# Patient Record
Sex: Male | Born: 2000 | Race: Black or African American | Hispanic: No | Marital: Single | Smoking: Never smoker
Health system: Southern US, Community
[De-identification: ages and names within clinical notes are randomized; demographics above are authoritative.]

---
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
## 2021-08-21 ENCOUNTER — Encounter (HOSPITAL_COMMUNITY): Payer: Self-pay | Admitting: Emergency Medicine

## 2021-08-21 ENCOUNTER — Emergency Department (HOSPITAL_COMMUNITY)
Admission: EM | Admit: 2021-08-21 | Discharge: 2021-08-21 | Disposition: A | Discharge status: Home / Self Care | Payer: Medicaid Other | Attending: Emergency Medicine | Admitting: Emergency Medicine

## 2021-08-21 DIAGNOSIS — Y990 Civilian activity done for income or pay: Secondary | ICD-10-CM | POA: Diagnosis not present

## 2021-08-21 DIAGNOSIS — X500XXA Overexertion from strenuous movement or load, initial encounter: Secondary | ICD-10-CM | POA: Insufficient documentation

## 2021-08-21 DIAGNOSIS — S3992XA Unspecified injury of lower back, initial encounter: Secondary | ICD-10-CM | POA: Diagnosis present

## 2021-08-21 DIAGNOSIS — S39012A Strain of muscle, fascia and tendon of lower back, initial encounter: Secondary | ICD-10-CM | POA: Diagnosis not present

## 2021-08-21 MED ORDER — METHOCARBAMOL 500 MG PO TABS: 500.0000 mg | ORAL_TABLET | Freq: Three times a day (TID) | ORAL | 0 refills | Status: AC | PRN

## 2021-08-21 MED ORDER — LIDOCAINE 4 % EX PTCH: 1.0000 | MEDICATED_PATCH | Freq: Two times a day (BID) | CUTANEOUS | 0 refills | Status: AC | PRN

## 2021-08-21 NOTE — ED Provider Notes (Signed)
?[facility] EMERGENCY DEPARTMENT ?Provider Note ? ? ?CSN: [id number] ?Arrival date & time: 08/21/21  1445 ? ?  ? ?History ? ?Chief Complaint  ?Patient presents with  ? Back Pain  ? ? ?Kyle Bean is a 21 y.o. male no pertinent past medical history.  Presents emergency department with a chief complaint of lumbar back pain.  Patient reports that pain started 4 days ago after lifting a heavy object while at work.  Patient reports that he has had pain to his lumbar back since then.  Pain radiates to right leg.  Patient has been taking some ibuprofen intermittently over the last 4 days with some improvement in pain.   ? ?Patient denies any recent falls or traumatic injuries.  Denies any IV drug use.  Denies any fever, chills, numbness, weakness, bowel/bladder dysfunction, saddle anesthesia, dysuria, hematuria, urinary urgency, urinary frequency, swelling or tenderness to genitals, penile discharge. ? ? ?Back Pain ?Associated symptoms: no abdominal pain, no chest pain, no dysuria, no fever, no headaches, no numbness and no weakness   ? ?  ? ?Home Medications ?Prior to Admission medications   ?Medication Sig Start Date End Date Taking? Authorizing Provider  ?benzonatate (TESSALON) 100 MG capsule Take 2 capsules (200 mg total) by mouth 3 (three) times daily as needed for cough. 05/12/16   Deatra Canter, FNP  ?ibuprofen (ADVIL,MOTRIN) 200 MG tablet Take 400 mg by mouth every 6 (six) hours as needed for moderate pain.    [provider]  ?ipratropium (ATROVENT) 0.06 % nasal spray Place 2 sprays into both nostrils 4 (four) times daily. 05/12/16   Deatra Canter, FNP  ?   ? ?Allergies    ?Patient has no known allergies.   ? ?Review of Systems   ?Review of Systems  ?Constitutional:  Negative for chills and fever.  ?Eyes:  Negative for visual disturbance.  ?Respiratory:  Negative for shortness of breath.   ?Cardiovascular:  Negative for chest pain.  ?Gastrointestinal:  Negative for abdominal  pain, nausea and vomiting.  ?Genitourinary:  Negative for decreased urine volume, difficulty urinating, dysuria, enuresis, flank pain, frequency, genital sores, hematuria, penile discharge, penile pain, penile swelling, scrotal swelling, testicular pain and urgency.  ?Musculoskeletal:  Positive for back pain. Negative for neck pain.  ?Skin:  Negative for color change and rash.  ?Neurological:  Negative for dizziness, syncope, weakness, light-headedness, numbness and headaches.  ?Psychiatric/Behavioral:  Negative for confusion.   ? ?Physical Exam ?Updated Vital Signs ?BP 139/84   Pulse 69   Resp 16   Ht 5\' 6"  (1.676 m)   SpO2 100%  ?Physical Exam ?Vitals and nursing note reviewed.  ?Constitutional:   ?   General: He is not in acute distress. ?   Appearance: He is not ill-appearing, toxic-appearing or diaphoretic.  ?Eyes:  ?   General:     ?   Right eye: No discharge.     ?   Left eye: No discharge.  ?   Extraocular Movements: Extraocular movements intact.  ?   Conjunctiva/sclera: Conjunctivae normal.  ?   Pupils: Pupils are equal, round, and reactive to light.  ?Cardiovascular:  ?   Rate and Rhythm: Normal rate.  ?Pulmonary:  ?   Effort: Pulmonary effort is normal.  ?Musculoskeletal:  ?   Cervical back: Normal range of motion and neck supple. No swelling, edema, deformity, erythema, signs of trauma, lacerations, rigidity, spasms, torticollis, tenderness, bony tenderness or crepitus. No pain with movement. Normal range of  motion.  ?   Thoracic back: No swelling, edema, deformity, signs of trauma, lacerations, spasms, tenderness or bony tenderness.  ?   Lumbar back: Tenderness present. No swelling, edema, deformity, signs of trauma, lacerations, spasms or bony tenderness.  ?   Comments: No midline tenderness or deformity to cervical, thoracic, or lumbar spine.  Tenderness to lumbar paraspinous muscles.  ?Skin: ?   General: Skin is warm and dry.  ?Neurological:  ?   General: No focal deficit present.  ?   Mental  Status: He is alert.  ?   GCS: GCS eye subscore is 4. GCS verbal subscore is 5. GCS motor subscore is 6.  ?   Cranial Nerves: No cranial nerve deficit or facial asymmetry.  ?   Sensory: Sensation is intact.  ?   Motor: No weakness, tremor or seizure activity.  ?   Coordination: Romberg sign negative. Finger-Nose-Finger Test normal.  ?   Gait: Gait is intact. Gait normal.  ?   Comments: CN II-XII intact, equal grip strength, +5 strength to bilateral upper and lower extremities  ?  ?Psychiatric:     ?   Behavior: Behavior is cooperative.  ? ? ?ED Results / Procedures / Treatments   ?Labs ?(all labs ordered are listed, but only abnormal results are displayed) ?Labs Reviewed - No data to display ? ?EKG ?None ? ?Radiology ?No results found. ? ?Procedures ?Procedures  ? ? ?Medications Ordered in ED ?Medications - No data to display ? ?ED Course/ Medical Decision Making/ A&P ?  ?                        ?Medical Decision Making ?Risk ?OTC drugs. ?Prescription drug management. ? ? ?Alert 21 year old male in no acute distress, nontoxic-appearing.  Presents emergency department with a chief complaint of back pain. ? ?Information obtained from patient.  Past medical records reviewed including previous provider notes, labs, and imaging. ? ?Low suspicion for cauda equina syndrome at this time as patient denies any bowel/bladder incontinence, saddle anesthesia.  Neuro exam is reassuring patient able to stand and ambulate without difficulty.  Low suspicion for epidural abscess as patient denies any IV drug use and is afebrile at this time.  Renal calculus versus pyelonephritis was considered as well however low suspicion this time as patient has no urinary symptoms. ? ?Suspect that patient's pain is musculoskeletal in nature as it started after lifting heavy objects while at work.  Will prescribe patient with short course of muscle relaxer as well as lidocaine patch.  Discussed symptomatic treatment with heat, over-the-counter pain  medication, and light stretching.  Patient to follow-up with PCP in outpatient setting if symptoms do not improve. ? ?Based on patient's chief complaint, I considered admission might be necessary, however after reassuring ED workup feel patient is reasonable for discharge.  Discussed results, findings, treatment and follow up. Patient advised of return precautions. Patient verbalized understanding and agreed with plan. ? ?Portions of this note were generated with Scientist, clinical (histocompatibility and immunogenetics)Dragon dictation software. Dictation errors may occur despite best attempts at proofreading. ? ? ? ? ? ? ? ? ?Final Clinical Impression(s) / ED Diagnoses ?Final diagnoses:  ?Strain of lumbar region, initial encounter  ? ? ?Rx / DC Orders ?ED Discharge Orders   ? ?      Ordered  ?  lidocaine (HM LIDOCAINE PATCH) 4 %  Every 12 hours PRN       ? 08/21/21 1514  ?  methocarbamol (ROBAXIN)  500 MG tablet  Every 8 hours PRN       ? 08/21/21 1514  ? ?  ?  ? ?  ? ? ?  ?Haskel Schroeder, PA-C ?08/21/21 1723 ? ?  ?Ernie Avena, MD ?08/21/21 1844 ? ?

## 2021-08-21 NOTE — Discharge Instructions (Signed)
You came to the emergency department today to be evaluated for your back pain.  Based on your physical exam and history of present illness your pain is likely musculoskeletal in nature and should improve over time.  I have given you prescription for lidocaine patches and the muscle laxer Robaxin.  Please take these medications as prescribed.  Additionally you may take Tylenol and ibuprofen as indicated below. ? ?Please take Ibuprofen (Advil, motrin) and Tylenol (acetaminophen) to relieve your pain.   ? ?You may take up to 600 MG (3 pills) of normal strength ibuprofen every 8 hours as needed.   ?You make take tylenol, up to 1,000 mg (two extra strength pills) every 8 hours as needed.  ? ?It is safe to take ibuprofen and tylenol at the same time as they work differently.  ? Do not take more than 3,000 mg tylenol in a 24 hour period (not more than one dose every 8 hours.  Please check all medication labels as many medications such as pain and cold medications may contain tylenol.  Do not drink alcohol while taking these medications.  Do not take other NSAID'S while taking ibuprofen (such as aleve or naproxen).  Please take ibuprofen with food to decrease stomach upset. ? ?Today you were prescribed Methocarbamol (Robaxin).  Methocarbamol (Robaxin) is used to treat muscle spasms/pain.  It works by helping to relax the muscles.  Drowsiness, dizziness, lightheadedness, stomach upset, nausea/vomiting, or blurred vision may occur.  Do not drive, use machinery, or do anything that needs alertness or clear vision until you can do it safely.  Do not combine this medication with alcoholic beverages, marijuana, or other central nervous system depressants.   ? ?Get help right away if: ?Your back pain is severe. ?You are unable to stand or walk. ?You develop pain in your legs. ?You develop weakness in your buttocks or legs. ?You have difficulty controlling when you urinate or when you have a bowel movement. ?You have frequent,  painful, or bloody urination. ?You have a temperature over 101.0?F (38.3?C) ?

## 2021-08-21 NOTE — ED Triage Notes (Signed)
Pt endorses lower back pain x4 days, no known injury.  ?

## 2022-05-09 ENCOUNTER — Other Ambulatory Visit: Payer: Self-pay

## 2022-05-09 ENCOUNTER — Encounter (HOSPITAL_COMMUNITY): Payer: Self-pay | Admitting: Emergency Medicine

## 2022-05-09 ENCOUNTER — Emergency Department (HOSPITAL_COMMUNITY)
Admission: EM | Admit: 2022-05-09 | Discharge: 2022-05-09 | Discharge status: Against Medical Advice | Payer: Medicaid Other | Attending: Emergency Medicine | Admitting: Emergency Medicine

## 2022-05-09 DIAGNOSIS — M545 Low back pain, unspecified: Secondary | ICD-10-CM | POA: Insufficient documentation

## 2022-05-09 DIAGNOSIS — Z5321 Procedure and treatment not carried out due to patient leaving prior to being seen by health care provider: Secondary | ICD-10-CM | POA: Diagnosis not present

## 2022-05-09 NOTE — ED Triage Notes (Addendum)
Pt c/o right lower back pain that radiates into right leg x1 week. Pt states he was seen for the same earlier this week and prescribed pain medication that helped but the pain got worse this morning and caused him to have difficulty walking. Pt walking at this time but states it is still painful. Denies injury.

## 2022-05-09 NOTE — ED Notes (Addendum)
Pt left AMA °

## 2023-08-09 ENCOUNTER — Other Ambulatory Visit: Payer: Self-pay

## 2023-08-09 ENCOUNTER — Encounter (HOSPITAL_BASED_OUTPATIENT_CLINIC_OR_DEPARTMENT_OTHER): Payer: Self-pay

## 2023-08-09 ENCOUNTER — Emergency Department (HOSPITAL_BASED_OUTPATIENT_CLINIC_OR_DEPARTMENT_OTHER)
Admission: EM | Admit: 2023-08-09 | Discharge: 2023-08-09 | Disposition: A | Discharge status: Home / Self Care | Attending: Emergency Medicine | Admitting: Emergency Medicine

## 2023-08-09 DIAGNOSIS — R1084 Generalized abdominal pain: Secondary | ICD-10-CM | POA: Insufficient documentation

## 2023-08-09 DIAGNOSIS — D72829 Elevated white blood cell count, unspecified: Secondary | ICD-10-CM | POA: Diagnosis not present

## 2023-08-09 DIAGNOSIS — R7401 Elevation of levels of liver transaminase levels: Secondary | ICD-10-CM | POA: Diagnosis not present

## 2023-08-09 DIAGNOSIS — R112 Nausea with vomiting, unspecified: Secondary | ICD-10-CM | POA: Insufficient documentation

## 2023-08-09 LAB — RESP PANEL BY RT-PCR (RSV, FLU A&B, COVID)  RVPGX2
Influenza A by PCR: NEGATIVE
Influenza B by PCR: NEGATIVE
Resp Syncytial Virus by PCR: NEGATIVE
SARS Coronavirus 2 by RT PCR: NEGATIVE

## 2023-08-09 LAB — URINALYSIS, ROUTINE W REFLEX MICROSCOPIC
Bacteria, UA: NONE SEEN
Bilirubin Urine: NEGATIVE
Glucose, UA: NEGATIVE mg/dL
Hgb urine dipstick: NEGATIVE
Ketones, ur: >80 mg/dL — AB
Leukocytes,Ua: NEGATIVE
Nitrite: NEGATIVE
Protein, ur: 30 mg/dL — AB
Specific Gravity, Urine: 1.033 — ABNORMAL HIGH (ref 1.005–1.030)
pH: 6 (ref 5.0–8.0)

## 2023-08-09 LAB — BASIC METABOLIC PANEL WITH GFR
Anion gap: 12 (ref 5–15)
BUN: 11 mg/dL (ref 6–20)
CO2: 25 mmol/L (ref 22–32)
Calcium: 8.8 mg/dL — ABNORMAL LOW (ref 8.9–10.3)
Chloride: 107 mmol/L (ref 98–111)
Creatinine, Ser: 1.01 mg/dL (ref 0.61–1.24)
GFR, Estimated: >60 mL/min (ref >60)
Glucose, Bld: 82 mg/dL (ref 70–99)
Potassium: 3.6 mmol/L (ref 3.5–5.1)
Sodium: 144 mmol/L (ref 135–145)

## 2023-08-09 LAB — COMPREHENSIVE METABOLIC PANEL WITH GFR
ALT: 42 U/L (ref 0–44)
AST: 54 U/L — ABNORMAL HIGH (ref 15–41)
Albumin: 4.9 g/dL (ref 3.5–5.0)
Alkaline Phosphatase: 61 U/L (ref 38–126)
Anion gap: 22 — ABNORMAL HIGH (ref 5–15)
BUN: 13 mg/dL (ref 6–20)
CO2: 14 mmol/L — ABNORMAL LOW (ref 22–32)
Calcium: 10.4 mg/dL — ABNORMAL HIGH (ref 8.9–10.3)
Chloride: 103 mmol/L (ref 98–111)
Creatinine, Ser: 1.05 mg/dL (ref 0.61–1.24)
GFR, Estimated: >60 mL/min (ref >60)
Glucose, Bld: 109 mg/dL — ABNORMAL HIGH (ref 70–99)
Potassium: 4 mmol/L (ref 3.5–5.1)
Sodium: 139 mmol/L (ref 135–145)
Total Bilirubin: 1.5 mg/dL — ABNORMAL HIGH (ref 0.0–1.2)
Total Protein: 7.9 g/dL (ref 6.5–8.1)

## 2023-08-09 LAB — CBC
HCT: 49.6 % (ref 39.0–52.0)
Hemoglobin: 16.9 g/dL (ref 13.0–17.0)
MCH: 29.4 pg (ref 26.0–34.0)
MCHC: 34.1 g/dL (ref 30.0–36.0)
MCV: 86.4 fL (ref 80.0–100.0)
Platelets: 249 10*3/uL (ref 150–400)
RBC: 5.74 MIL/uL (ref 4.22–5.81)
RDW: 13.7 % (ref 11.5–15.5)
WBC: 14.3 10*3/uL — ABNORMAL HIGH (ref 4.0–10.5)
nRBC: 0 % (ref 0.0–0.2)

## 2023-08-09 LAB — LIPASE, BLOOD: Lipase: 14 U/L (ref 11–51)

## 2023-08-09 MED ORDER — ALUM & MAG HYDROXIDE-SIMETH 200-200-20 MG/5ML PO SUSP
30.0000 mL | Freq: Once | ORAL | Status: AC
  Administered 2023-08-09: 30 mL via ORAL
  Filled 2023-08-09: qty 30

## 2023-08-09 MED ORDER — ONDANSETRON HCL 4 MG PO TABS
4.0000 mg | ORAL_TABLET | Freq: Four times a day (QID) | ORAL | 0 refills | Status: DC
  Filled 2023-08-09: qty 12, 3d supply, fill #0

## 2023-08-09 MED ORDER — SODIUM CHLORIDE 0.9 % IV BOLUS
1000.0000 mL | Freq: Once | INTRAVENOUS | Status: AC
  Administered 2023-08-09: 1000 mL via INTRAVENOUS

## 2023-08-09 MED ORDER — SODIUM CHLORIDE 0.9 % IV BOLUS
1000.0000 mL | Freq: Once | INTRAVENOUS | Status: AC
Start: 2023-08-09 — End: 2023-08-09
  Administered 2023-08-09: 1000 mL via INTRAVENOUS

## 2023-08-09 MED ORDER — ONDANSETRON HCL 4 MG PO TABS: 4.0000 mg | ORAL_TABLET | Freq: Four times a day (QID) | ORAL | 0 refills | Status: AC

## 2023-08-09 MED ORDER — LIDOCAINE VISCOUS HCL 2 % MT SOLN
15.0000 mL | Freq: Once | OROMUCOSAL | Status: AC
Start: 2023-08-09 — End: 2023-08-09
  Administered 2023-08-09: 15 mL via ORAL
  Filled 2023-08-09: qty 15

## 2023-08-09 MED ORDER — ONDANSETRON HCL 4 MG/2ML IJ SOLN
4.0000 mg | Freq: Once | INTRAMUSCULAR | Status: AC
  Administered 2023-08-09: 4 mg via INTRAVENOUS
  Filled 2023-08-09: qty 2

## 2023-08-09 MED ORDER — ONDANSETRON 4 MG PO TBDP
4.0000 mg | ORAL_TABLET | Freq: Once | ORAL | Status: AC | PRN
  Administered 2023-08-09: 4 mg via ORAL
  Filled 2023-08-09: qty 1

## 2023-08-09 NOTE — ED Provider Notes (Signed)
 [name] EMERGENCY DEPARTMENT AT [facility] Provider Note   CSN: [id number] Arrival date & time: 08/09/23  1137     History  Chief Complaint  Patient presents with   Nausea   Emesis    Kyle Bean is a 23 y.o. male.  23 y.o male with no PMH presents to the ED via EMS with a chief complaint of abdominal pain, nausea, vomiting which began last night.  Patient reports multiple episodes of nonbilious, nonbloody emesis.  He also endorses some generalized abdominal pain.  He reports having increasing alcohol intake last night, states "this likely did not improve my symptoms ".  He was given Zofran  with EMS which helped resolve the vomiting.  He does not have any prior history of surgical intervention to his abdomen, no fever, no chills, no chest pain.   The history is provided by the patient.  Emesis Associated symptoms: abdominal pain   Associated symptoms: no chills, no diarrhea, no fever and no sore throat        Home Medications Prior to Admission medications   Medication Sig Start Date End Date Taking? Authorizing Provider  ondansetron  (ZOFRAN ) 4 MG tablet Take 1 tablet (4 mg total) by mouth every 6 (six) hours. 08/09/23  Yes Rabecca Birge, PA-C  benzonatate  (TESSALON ) 100 MG capsule Take 2 capsules (200 mg total) by mouth 3 (three) times daily as needed for cough. 05/12/16   Doc Freed, FNP  ibuprofen  (ADVIL ,MOTRIN ) 200 MG tablet Take 400 mg by mouth every 6 (six) hours as needed for moderate pain.    [provider]  ipratropium (ATROVENT ) 0.06 % nasal spray Place 2 sprays into both nostrils 4 (four) times daily. 05/12/16   Doc Freed, FNP  lidocaine  (HM LIDOCAINE  PATCH) 4 % Place 1 patch onto the skin every 12 (twelve) hours as needed. 08/21/21   Marshal Skeens, PA-C  methocarbamol  (ROBAXIN ) 500 MG tablet Take 1 tablet (500 mg total) by mouth every 8 (eight) hours as needed for muscle spasms. 08/21/21   Marshal Skeens, PA-C       Allergies    Patient has no known allergies.    Review of Systems   Review of Systems  Constitutional:  Negative for chills and fever.  HENT:  Negative for sore throat.   Respiratory:  Negative for shortness of breath.   Cardiovascular:  Negative for chest pain.  Gastrointestinal:  Positive for abdominal pain, nausea and vomiting. Negative for blood in stool, constipation and diarrhea.  Genitourinary:  Negative for flank pain.  All other systems reviewed and are negative.   Physical Exam Updated Vital Signs BP 132/86   Pulse 89   Temp 97.9 F (36.6 C) (Oral)   Resp 16   Ht 5\' 6"  (1.676 m)   Wt 82.7 kg   SpO2 100%   BMI 29.43 kg/m  Physical Exam Vitals and nursing note reviewed.  Constitutional:      Appearance: Normal appearance.  HENT:     Head: Normocephalic and atraumatic.     Mouth/Throat:     Mouth: Mucous membranes are moist.  Eyes:     Pupils: Pupils are equal, round, and reactive to light.  Cardiovascular:     Rate and Rhythm: Normal rate.  Pulmonary:     Effort: Pulmonary effort is normal.  Abdominal:     General: Abdomen is flat.     Palpations: Abdomen is soft.     Tenderness: There is no abdominal tenderness. There  is no right CVA tenderness or left CVA tenderness.  Musculoskeletal:     Cervical back: Normal range of motion and neck supple.  Skin:    General: Skin is warm and dry.  Neurological:     Mental Status: He is alert and oriented to person, place, and time.     ED Results / Procedures / Treatments   Labs (all labs ordered are listed, but only abnormal results are displayed) Labs Reviewed  COMPREHENSIVE METABOLIC PANEL WITH GFR - Abnormal; Notable for the following components:      Result Value   CO2 14 (*)    Glucose, Bld 109 (*)    Calcium 10.4 (*)    AST 54 (*)    Total Bilirubin 1.5 (*)    Anion gap 22 (*)    All other components within normal limits  CBC - Abnormal; Notable for the following components:   WBC 14.3 (*)     All other components within normal limits  URINALYSIS, ROUTINE W REFLEX MICROSCOPIC - Abnormal; Notable for the following components:   Specific Gravity, Urine 1.033 (*)    Ketones, ur >80 (*)    Protein, ur 30 (*)    All other components within normal limits  BASIC METABOLIC PANEL WITH GFR - Abnormal; Notable for the following components:   Calcium 8.8 (*)    All other components within normal limits  RESP PANEL BY RT-PCR (RSV, FLU A&B, COVID)  RVPGX2  LIPASE, BLOOD    EKG None  Radiology No results found.  Procedures Procedures    Medications Ordered in ED Medications  ondansetron  (ZOFRAN -ODT) disintegrating tablet 4 mg (4 mg Oral Given 08/09/23 1201)  sodium chloride  0.9 % bolus 1,000 mL (0 mLs Intravenous Stopped 08/09/23 1457)  alum & mag hydroxide-simeth (MAALOX/MYLANTA) 200-200-20 MG/5ML suspension 30 mL (30 mLs Oral Given 08/09/23 1343)    And  lidocaine  (XYLOCAINE ) 2 % viscous mouth solution 15 mL (15 mLs Oral Given 08/09/23 1343)  sodium chloride  0.9 % bolus 1,000 mL (0 mLs Intravenous Stopped 08/09/23 1652)  ondansetron  (ZOFRAN ) injection 4 mg (4 mg Intravenous Given 08/09/23 1757)    ED Course/ Medical Decision Making/ A&P Clinical Course as of 08/09/23 1826  Fri Aug 09, 2023  1455 WBC(!): 14.3 [JS]    Clinical Course User Index [JS] Offie Waide, PA-C                                 Medical Decision Making Amount and/or Complexity of Data Reviewed Labs: ordered. Decision-making details documented in ED Course.  Risk OTC drugs. Prescription drug management.   This patient presents to the ED for concern of abdominal pain, this involves a number of treatment options, and is a complaint that carries with it a high risk of complications and morbidity.  The differential diagnosis includes obstruction, cholecystitis, appendicitis, viral illness.   Co morbidities: Discussed in HPI   Brief History:  See HPI.  EMR reviewed including pt PMHx, past surgical  history and past visits to ER.   See HPI for more details   Lab Tests:  I ordered and independently interpreted labs.  The pertinent results include:    CBC with leukocytosis of 14.3, hemoglobin is within normal limits.  CMP with no electrolyte derangement, LFTs slight elevation of AST, did have alcohol consumption yesterday.  Anion gap of 22, will obtain repeat after hydration. 6:22 PM repeat BMP showed an improved  gap of 2:12 liter bolus.  Imaging Studies:  N/A  Medicines ordered:  I ordered medication including zofran ,maalox,bolus  for symptomatic treatment. Reevaluation of the patient after these medicines showed that the patient improved I have reviewed the patients home medicines and have made adjustments as needed  Reevaluation:  After the interventions noted above I re-evaluated patient and found that they have :improved  Social Determinants of Health:  The patient's social determinants of health were a factor in the care of this patient  Problem List / ED Course:  Patient presents to the ED with a chief complaint of nausea, vomiting which began last night.  This her endorse alcohol intake yesterday.  Has not take any medication for improvement in symptoms.  On arrival patient is actively vomiting, given Zofran  by EMS which did help.  CBC with no leukocytosis, CMP is remarkable for an anion gap of 22, given Zofran , 2 L, time to metabolize with improvement in his symptoms.  He has been tolerating p.o. while in the ED.  A repeat BMP was obtained within and a gap of 12 improved.  No focal tenderness to his abdomen.  I do not suspect appendicitis, gallbladder etiology. In addition given Maalox as he reports multiple episodes of vomiting throughout the night.  Some increase in his heart rate concern for dehydration.  Patient was drinking Gatorade when suddenly he had a another episode of vomiting, he reports "I feel like I drink it too fast ".  He remains hemodynamically stable,  and afebrile and has been monitored in the emergency department for approximately 6-1/2 hours, I do not feel there is any urgent workup needed at this time.  He is agreeable to plan and treatment, return precautions discussed at length.  Patient hemodynamically stable for discharge.  Dispostion:  After consideration of the diagnostic results and the patients response to treatment, I feel that the patent would benefit from outpatient follow-up with PCP, continue to push fluids.  Portions of this note were generated with Scientist, clinical (histocompatibility and immunogenetics). Dictation errors may occur despite best attempts at proofreading.   Final Clinical Impression(s) / ED Diagnoses Final diagnoses:  Nausea and vomiting, unspecified vomiting type    Rx / DC Orders ED Discharge Orders          Ordered    ondansetron  (ZOFRAN ) 4 MG tablet  Every 6 hours        08/09/23 1821              Ulice Follett, PA-C 08/09/23 1826    Arvilla Birmingham, MD 08/10/23 [id number]

## 2023-08-09 NOTE — Discharge Instructions (Signed)
 Your laboratory was within normal limits today.  You were given a short prescription for Zofran  to help with your nausea, please take this medication as prescribed.

## 2023-08-09 NOTE — ED Notes (Signed)
 Patient has tolerated PO liquids. He had drank 4 glasses of water and is starting a gatorade. No nausea or vomiting.

## 2023-08-09 NOTE — ED Triage Notes (Signed)
 Pt arrived via [facility] from home c/o N/V/D, abd pain, and headache since this morning. ETOH use last night, denies drug use. Denies fever/chills, cough.   EMS VS BP 140/98 HR 98 SpO2 98% RA CBG 143

## 2023-08-10 ENCOUNTER — Other Ambulatory Visit (HOSPITAL_BASED_OUTPATIENT_CLINIC_OR_DEPARTMENT_OTHER): Payer: Self-pay
